# Patient Record
Sex: Female | Born: 1975 | Race: White | Hispanic: No | Marital: Married | Smoking: Never smoker
Health system: Southern US, Community
[De-identification: ages and names within clinical notes are randomized; demographics above are authoritative.]

---

## 2019-08-02 ENCOUNTER — Emergency Department (HOSPITAL_COMMUNITY): Payer: BC Managed Care – PPO

## 2019-08-02 ENCOUNTER — Other Ambulatory Visit: Payer: Self-pay

## 2019-08-02 ENCOUNTER — Encounter (HOSPITAL_COMMUNITY): Payer: Self-pay | Admitting: Emergency Medicine

## 2019-08-02 ENCOUNTER — Observation Stay (HOSPITAL_COMMUNITY)
Admission: EM | Admit: 2019-08-02 | Discharge: 2019-08-04 | Disposition: A | Discharge status: Home / Self Care | Payer: BC Managed Care – PPO | Attending: General Surgery | Admitting: General Surgery

## 2019-08-02 DIAGNOSIS — K8010 Calculus of gallbladder with chronic cholecystitis without obstruction: Secondary | ICD-10-CM | POA: Diagnosis not present

## 2019-08-02 DIAGNOSIS — Z20822 Contact with and (suspected) exposure to covid-19: Secondary | ICD-10-CM | POA: Insufficient documentation

## 2019-08-02 DIAGNOSIS — K8050 Calculus of bile duct without cholangitis or cholecystitis without obstruction: Secondary | ICD-10-CM

## 2019-08-02 DIAGNOSIS — Z91030 Bee allergy status: Secondary | ICD-10-CM | POA: Diagnosis not present

## 2019-08-02 DIAGNOSIS — Z87442 Personal history of urinary calculi: Secondary | ICD-10-CM | POA: Diagnosis not present

## 2019-08-02 DIAGNOSIS — R10811 Right upper quadrant abdominal tenderness: Secondary | ICD-10-CM

## 2019-08-02 DIAGNOSIS — Z91018 Allergy to other foods: Secondary | ICD-10-CM | POA: Diagnosis not present

## 2019-08-02 DIAGNOSIS — K819 Cholecystitis, unspecified: Secondary | ICD-10-CM | POA: Diagnosis present

## 2019-08-02 DIAGNOSIS — Z87891 Personal history of nicotine dependence: Secondary | ICD-10-CM | POA: Diagnosis not present

## 2019-08-02 DIAGNOSIS — Z87892 Personal history of anaphylaxis: Secondary | ICD-10-CM | POA: Diagnosis not present

## 2019-08-02 LAB — BASIC METABOLIC PANEL
Anion gap: 13 (ref 5–15)
BUN: 8 mg/dL (ref 6–20)
CO2: 23 mmol/L (ref 22–32)
Calcium: 9.6 mg/dL (ref 8.9–10.3)
Chloride: 104 mmol/L (ref 98–111)
Creatinine, Ser: 0.78 mg/dL (ref 0.44–1.00)
GFR calc Af Amer: >60 mL/min (ref >60)
GFR calc non Af Amer: >60 mL/min (ref >60)
Glucose, Bld: 102 mg/dL — ABNORMAL HIGH (ref 70–99)
Potassium: 3.7 mmol/L (ref 3.5–5.1)
Sodium: 140 mmol/L (ref 135–145)

## 2019-08-02 LAB — CBC
HCT: 42.3 % (ref 36.0–46.0)
Hemoglobin: 14.1 g/dL (ref 12.0–15.0)
MCH: 30.8 pg (ref 26.0–34.0)
MCHC: 33.3 g/dL (ref 30.0–36.0)
MCV: 92.4 fL (ref 80.0–100.0)
Platelets: 292 10*3/uL (ref 150–400)
RBC: 4.58 MIL/uL (ref 3.87–5.11)
RDW: 12.3 % (ref 11.5–15.5)
WBC: 8.4 10*3/uL (ref 4.0–10.5)
nRBC: 0 % (ref 0.0–0.2)

## 2019-08-02 LAB — HEPATIC FUNCTION PANEL
ALT: 17 U/L (ref 0–44)
AST: 19 U/L (ref 15–41)
Albumin: 4.3 g/dL (ref 3.5–5.0)
Alkaline Phosphatase: 62 U/L (ref 38–126)
Bilirubin, Direct: 0.1 mg/dL (ref 0.0–0.2)
Indirect Bilirubin: 0.6 mg/dL (ref 0.3–0.9)
Total Bilirubin: 0.7 mg/dL (ref 0.3–1.2)
Total Protein: 7.4 g/dL (ref 6.5–8.1)

## 2019-08-02 LAB — I-STAT BETA HCG BLOOD, ED (MC, WL, AP ONLY): I-stat hCG, quantitative: <5 m[IU]/mL (ref <5)

## 2019-08-02 LAB — D-DIMER, QUANTITATIVE: D-Dimer, Quant: 1 ug/mL-FEU — ABNORMAL HIGH (ref 0.00–0.50)

## 2019-08-02 LAB — TROPONIN I (HIGH SENSITIVITY)
Troponin I (High Sensitivity): <2 ng/L (ref <18)
Troponin I (High Sensitivity): <2 ng/L (ref <18)

## 2019-08-02 LAB — LIPASE, BLOOD: Lipase: 22 U/L (ref 11–51)

## 2019-08-02 MED ORDER — ACETAMINOPHEN 325 MG PO TABS
650.0000 mg | ORAL_TABLET | Freq: Four times a day (QID) | ORAL | Status: DC | PRN
  Administered 2019-08-03: 650 mg via ORAL
  Filled 2019-08-02: qty 2

## 2019-08-02 MED ORDER — SODIUM CHLORIDE 0.9 % IV SOLN
2.0000 g | Freq: Once | INTRAVENOUS | Status: AC
  Administered 2019-08-02: 2 g via INTRAVENOUS
  Filled 2019-08-02: qty 20

## 2019-08-02 MED ORDER — SODIUM CHLORIDE 0.9 % IV SOLN
2.0000 g | INTRAVENOUS | Status: DC
  Filled 2019-08-02: qty 20

## 2019-08-02 MED ORDER — ONDANSETRON 4 MG PO TBDP: 4.0000 mg | ORAL_TABLET | Freq: Four times a day (QID) | ORAL | Status: DC | PRN

## 2019-08-02 MED ORDER — ONDANSETRON HCL 4 MG/2ML IJ SOLN
4.0000 mg | Freq: Four times a day (QID) | INTRAMUSCULAR | Status: DC | PRN
  Administered 2019-08-03 – 2019-08-04 (×4): 4 mg via INTRAVENOUS
  Filled 2019-08-02 (×3): qty 2

## 2019-08-02 MED ORDER — MORPHINE SULFATE (PF) 4 MG/ML IV SOLN
4.0000 mg | Freq: Once | INTRAVENOUS | Status: AC
  Administered 2019-08-02: 4 mg via INTRAVENOUS
  Filled 2019-08-02: qty 1

## 2019-08-02 MED ORDER — ACETAMINOPHEN 650 MG RE SUPP: 650.0000 mg | Freq: Four times a day (QID) | RECTAL | Status: DC | PRN

## 2019-08-02 MED ORDER — SIMETHICONE 80 MG PO CHEW
40.0000 mg | CHEWABLE_TABLET | Freq: Four times a day (QID) | ORAL | Status: DC | PRN
  Filled 2019-08-02: qty 1

## 2019-08-02 MED ORDER — ALUM & MAG HYDROXIDE-SIMETH 200-200-20 MG/5ML PO SUSP
30.0000 mL | Freq: Once | ORAL | Status: AC
  Administered 2019-08-02: 30 mL via ORAL
  Filled 2019-08-02: qty 30

## 2019-08-02 MED ORDER — ENOXAPARIN SODIUM 40 MG/0.4ML ~~LOC~~ SOLN
40.0000 mg | SUBCUTANEOUS | Status: DC
  Administered 2019-08-03: 40 mg via SUBCUTANEOUS
  Filled 2019-08-02: qty 0.4

## 2019-08-02 MED ORDER — MORPHINE SULFATE (PF) 2 MG/ML IV SOLN
1.0000 mg | INTRAVENOUS | Status: DC | PRN
  Administered 2019-08-02 – 2019-08-03 (×2): 1 mg via INTRAVENOUS
  Filled 2019-08-02 (×2): qty 1

## 2019-08-02 MED ORDER — FAMOTIDINE 20 MG PO TABS
20.0000 mg | ORAL_TABLET | Freq: Once | ORAL | Status: AC
  Administered 2019-08-02: 20 mg via ORAL
  Filled 2019-08-02: qty 1

## 2019-08-02 MED ORDER — IOHEXOL 350 MG/ML SOLN
80.0000 mL | Freq: Once | INTRAVENOUS | Status: AC | PRN
  Administered 2019-08-02: 80 mL via INTRAVENOUS

## 2019-08-02 MED ORDER — SODIUM CHLORIDE 0.9% FLUSH
3.0000 mL | Freq: Once | INTRAVENOUS | Status: AC
  Administered 2019-08-02: 3 mL via INTRAVENOUS

## 2019-08-02 MED ORDER — SODIUM CHLORIDE 0.9 % IV SOLN: INTRAVENOUS | Status: DC

## 2019-08-02 MED ORDER — KETOROLAC TROMETHAMINE 15 MG/ML IJ SOLN
15.0000 mg | Freq: Four times a day (QID) | INTRAMUSCULAR | Status: DC | PRN
  Administered 2019-08-03 – 2019-08-04 (×2): 15 mg via INTRAVENOUS
  Filled 2019-08-02 (×2): qty 1

## 2019-08-02 NOTE — Discharge Instructions (Addendum)
[facility] ______[facility] °LAPAROSCOPIC SURGERY: POST OP INSTRUCTIONS °Always review your discharge instruction sheet given to you by the facility where your surgery was performed. °IF YOU HAVE DISABILITY OR FAMILY LEAVE FORMS, YOU MUST BRING THEM TO THE OFFICE FOR PROCESSING.   °DO NOT GIVE THEM TO YOUR DOCTOR. ° °1. A prescription for pain medication may be given to you upon discharge.  Take your pain medication as prescribed, if needed.  If narcotic pain medicine is not needed, then you may take acetaminophen (Tylenol) or ibuprofen (Advil) as needed. °2. Take your usually prescribed medications unless otherwise directed. °3. If you need a refill on your pain medication, please contact your pharmacy.  They will contact our office to request authorization. Prescriptions will not be filled after 5pm or on week-ends. °4. You should follow a light diet the first few days after arrival home, such as soup and crackers, etc.  Be sure to include lots of fluids daily. °5. Most patients will experience some swelling and bruising in the area of the incisions.  Ice packs will help.  Swelling and bruising can take several days to resolve.  °6. It is common to experience some constipation if taking pain medication after surgery.  Increasing fluid intake and taking a stool softener (such as Colace) will usually help or prevent this problem from occurring.  A mild laxative (Milk of Magnesia or Miralax) should be taken according to package instructions if there are no bowel movements after 48 hours. °7. Unless discharge instructions indicate otherwise, you may remove your bandages 24-48 hours after surgery, and you may shower at that time.  You may have steri-strips (small skin tapes) in place directly over the incision.  These strips should be left on the skin for 7-10 days.  If your surgeon used skin glue on the incision, you may shower in 24 hours.  The glue will flake off over the next 2-3 weeks.  Any sutures or  staples will be removed at the office during your follow-up visit. °8. ACTIVITIES:  You may resume regular (light) daily activities beginning the next day--such as daily self-care, walking, climbing stairs--gradually increasing activities as tolerated.  You may have sexual intercourse when it is comfortable.  Refrain from any heavy lifting or straining until approved by your doctor. °a. You may drive when you are no longer taking prescription pain medication, you can comfortably wear a seatbelt, and you can safely maneuver your car and apply brakes. °b. RETURN TO WORK:  __________________________________________________________ °9. You should see your doctor in the office for a follow-up appointment approximately 2-3 weeks after your surgery.  Make sure that you call for this appointment within a day or two after you arrive home to insure a convenient appointment time. °10. OTHER INSTRUCTIONS: __________________________________________________________________________________________________________________________ __________________________________________________________________________________________________________________________ °WHEN TO CALL YOUR DOCTOR: °1. Fever over 101.0 °2. Inability to urinate °3. Continued bleeding from incision. °4. Increased pain, redness, or drainage from the incision. °5. Increasing abdominal pain ° °The clinic staff is available to answer your questions during regular business hours.  Please don’t hesitate to call and ask to speak to one of the nurses for clinical concerns.  If you have a medical emergency, go to the nearest emergency room or call 911.  A surgeon from [facility] is always on call at the hospital. °[address], [address], [place], [place]  [address] ? [address] [address], [place],    [address] °[phone] ? [phone] ? FAX [phone] °Web site:   [email or web address] °

## 2019-08-02 NOTE — ED Notes (Signed)
PIV to RAC noted to be infiltrated. PIV removed, catheter intact.  18G PIV initiated to LAC. Flushes with 10 cc NS without s/s of infiltration. Positive blood return, secured with tape and tegaderm

## 2019-08-02 NOTE — ED Notes (Signed)
All meds given per [facility]. Name/DOB verified with pt. Surgery MD at bedside.  Pt remains in NAD. VSS. Breathing easy, non-labored.

## 2019-08-02 NOTE — ED Notes (Signed)
Assumed care of pt. Pt alert, speaking in full sentences. Breathing easy, non-labored. Pt resting on cart in NAD. Call light within reach

## 2019-08-02 NOTE — ED Provider Notes (Addendum)
[facility] EMERGENCY DEPARTMENT Provider Note   CSN: [id number] Arrival date & time: 08/02/19  1218     History Chief Complaint  Patient presents with  . Chest Pain    Joyce Cervantes is a 44 y.o. female.  HPI   Pt is a 44 y/o female with a h/o nephrolithiasis, who presents to the ED today for eval of chest pain. States she had an episode of right sided chest pain that started at 12:30AM this morning that woke her up from sleep. States pain was excruciating and radiated to her right shoulder and back. States episode lasted for about 15 minutes. Tried to drink some water which made her feel nauseated. States she was at work this AM and again experienced mid to right sided pain that felt like a squeezing and a heaviness. Started while walking in from her car. Reports mild associated sob as well. Took four 81 mg ASA at 12PM. This improved sxs.  She denies that her chest pain is specifically provoked with exertion as she did not have any during other times when she exerted herself today. Denies associated fevers, vomiting, abd pain, cough.   Denies leg pain/swelling, hemoptysis, recent surgery/trauma, recent long travel, hormone use, personal hx of cancer, or hx of DVT/PE. Has a h/o tobacco use but quit 6 years ago. Used to smoke 1.5ppd for at least 15-20 years. Denies h/o HTN, HLD, DM. Pts father died of an MI at 24.   History reviewed. No pertinent past medical history.  Patient Active Problem List   Diagnosis Date Noted  . Cholecystitis 08/02/2019    History reviewed. No pertinent surgical history.   OB History   No obstetric history on file.     History reviewed. No pertinent family history.  Social History   Tobacco Use  . Smoking status: Never Smoker  Substance Use Topics  . Alcohol use: Yes  . Drug use: Never    Home Medications Prior to Admission medications   Medication Sig Start Date End Date Taking? Authorizing Provider  ibuprofen  (ADVIL) 200 MG tablet Take 400 mg by mouth every 6 (six) hours as needed for headache (pain/cramps).   Yes [provider]    Allergies    Bee venom and Raspberry  Review of Systems   Review of Systems  Constitutional: Positive for diaphoresis. Negative for fever.  HENT: Negative for ear pain and sore throat.   Eyes: Negative for visual disturbance.  Respiratory: Positive for shortness of breath. Negative for cough.   Cardiovascular: Positive for chest pain and palpitations. Negative for leg swelling.  Gastrointestinal: Positive for nausea. Negative for abdominal pain, constipation, diarrhea and vomiting.  Genitourinary: Negative for dysuria and hematuria.  Musculoskeletal: Negative for back pain.  Skin: Negative for rash.  Neurological: Positive for light-headedness. Negative for dizziness.  All other systems reviewed and are negative.   Physical Exam Updated Vital Signs BP 132/80 (BP Location: Left Arm)   Pulse 87   Temp 98 F (36.7 C) (Oral)   Resp 16   Ht 5\' 7"  (1.702 m)   Wt 80.3 kg   LMP 07/15/2019   SpO2 100%   BMI 27.72 kg/m   Physical Exam Vitals and nursing note reviewed.  Constitutional:      General: She is not in acute distress.    Appearance: She is well-developed. She is not ill-appearing or toxic-appearing.  HENT:     Head: Normocephalic and atraumatic.  Eyes:  Conjunctiva/sclera: Conjunctivae normal.  Cardiovascular:     Rate and Rhythm: Normal rate and regular rhythm.     Heart sounds: Normal heart sounds. No murmur.  Pulmonary:     Effort: Pulmonary effort is normal. No respiratory distress.     Breath sounds: Normal breath sounds. No decreased breath sounds, wheezing, rhonchi or rales.  Abdominal:     Palpations: Abdomen is soft.     Tenderness: There is no abdominal tenderness.  Musculoskeletal:     Cervical back: Neck supple.     Right lower leg: No tenderness. No edema.     Left lower leg: No tenderness. No edema.  Skin:     General: Skin is warm and dry.  Neurological:     Mental Status: She is alert.     ED Results / Procedures / Treatments   Labs (all labs ordered are listed, but only abnormal results are displayed) Labs Reviewed  BASIC METABOLIC PANEL - Abnormal; Notable for the following components:      Result Value   Glucose, Bld 102 (*)    All other components within normal limits  D-DIMER, QUANTITATIVE (NOT AT [facility]) - Abnormal; Notable for the following components:   D-Dimer, Quant 1.00 (*)    All other components within normal limits  SARS CORONAVIRUS 2 (TAT 6-24 HRS)  CBC  HEPATIC FUNCTION PANEL  COMPREHENSIVE METABOLIC PANEL  CBC  LIPASE, BLOOD  I-STAT BETA HCG BLOOD, ED ([facility], [facility], [facility] ONLY)  TROPONIN I (HIGH SENSITIVITY)  TROPONIN I (HIGH SENSITIVITY)    EKG EKG Interpretation  Date/Time:  Monday August 02 2019 12:23:14 EST Ventricular Rate:  77 PR Interval:  118 QRS Duration: 92 QT Interval:  380 QTC Calculation: 430 R Axis:   67 Text Interpretation: Normal sinus rhythm with sinus arrhythmia Normal ECG NO prior ECG for comparisonl No STEMI Confirmed by Theda Belfast ([id number]) on 08/02/2019 2:00:45 PM   Radiology DG Chest 2 View  Result Date: 08/02/2019 CLINICAL DATA:  Chest pain EXAM: CHEST - 2 VIEW COMPARISON:  None. FINDINGS: The heart size and mediastinal contours are within normal limits. Both lungs are clear. No pleural effusion or pneumothorax. The visualized skeletal structures are unremarkable. IMPRESSION: No acute process in the chest. Electronically Signed   By: Guadlupe Spanish M.D.   On: 08/02/2019 13:39   CT Angio Chest PE W and/or Wo Contrast  Result Date: 08/02/2019 CLINICAL DATA:  Chest pain. Positive D-dimer. EXAM: CT ANGIOGRAPHY CHEST WITH CONTRAST TECHNIQUE: Multidetector CT imaging of the chest was performed using the standard protocol during bolus administration of intravenous contrast. Multiplanar CT image reconstructions and MIPs were obtained to evaluate the  vascular anatomy. CONTRAST:  68mL OMNIPAQUE IOHEXOL 350 MG/ML SOLN COMPARISON:  Chest x-ray dated 08/02/2019 FINDINGS: Cardiovascular: Satisfactory opacification of the pulmonary arteries to the segmental level. No evidence of pulmonary embolism. Normal heart size. No pericardial effusion. Mediastinum/Nodes: No enlarged mediastinal, hilar, or axillary lymph nodes. Thyroid gland, trachea, and esophagus demonstrate no significant findings. Lungs/Pleura: Lungs are clear. No pleural effusion or pneumothorax. Upper Abdomen: No acute abnormality. 2 cm calcified gallstone. Musculoskeletal: No chest wall abnormality. No acute or significant osseous findings. Review of the MIP images confirms the above findings. IMPRESSION: 1. No pulmonary emboli or other acute abnormalities. 2. 2 cm calcified gallstone. Electronically Signed   By: Francene Boyers M.D.   On: 08/02/2019 18:46   US Abdomen Limited RUQ  Result Date: 08/02/2019 CLINICAL DATA:  44 year old female with chest pain, right upper quadrant  pain for 1 day. 2 centimeter gallstone on chest CTA earlier today. EXAM: ULTRASOUND ABDOMEN LIMITED RIGHT UPPER QUADRANT COMPARISON:  Chest CTA 1828 hours today. FINDINGS: Gallbladder: Relatively large 24 millimeter shadowing gallstone (image 3). However, gallbladder wall thickness remains normal. No pericholecystic fluid. However, positive sonographic Eulah Pont sign is reported by the technologist. Common bile duct: Diameter: 1-2 millimeters, normal. Liver: No focal lesion identified. Within normal limits in parenchymal echogenicity. Portal vein is patent on color Doppler imaging with normal direction of blood flow towards the liver. Other: Negative visible right kidney. IMPRESSION: 1. A 2.4 cm gallstone is re-demonstrated, and a positive sonographic Murphy's sign is reported, although absent gallbladder wall thickening argues against Acute Cholecystitis. 2. No evidence of bile duct obstruction.  Negative liver. Electronically  Signed   By: Odessa Fleming M.D.   On: 08/02/2019 20:25    Procedures Procedures (including critical care time)  Medications Ordered in ED Medications  cefTRIAXone (ROCEPHIN) 2 g in sodium chloride 0.9 % 100 mL IVPB (2 g Intravenous New Bag/Given 08/02/19 2114)  enoxaparin (LOVENOX) injection 40 mg (has no administration in time range)  0.9 %  sodium chloride infusion (has no administration in time range)  cefTRIAXone (ROCEPHIN) 2 g in sodium chloride 0.9 % 100 mL IVPB (has no administration in time range)  acetaminophen (TYLENOL) tablet 650 mg (has no administration in time range)    Or  acetaminophen (TYLENOL) suppository 650 mg (has no administration in time range)  ketorolac (TORADOL) 15 MG/ML injection 15 mg (has no administration in time range)  morphine 2 MG/ML injection 1 mg (has no administration in time range)  ondansetron (ZOFRAN-ODT) disintegrating tablet 4 mg (has no administration in time range)    Or  ondansetron (ZOFRAN) injection 4 mg (has no administration in time range)  simethicone (MYLICON) chewable tablet 40 mg (has no administration in time range)  sodium chloride flush (NS) 0.9 % injection 3 mL (3 mLs Intravenous Given 08/02/19 1244)  alum & mag hydroxide-simeth (MAALOX/MYLANTA) 200-200-20 MG/5ML suspension 30 mL (30 mLs Oral Given 08/02/19 1429)  famotidine (PEPCID) tablet 20 mg (20 mg Oral Given 08/02/19 1429)  iohexol (OMNIPAQUE) 350 MG/ML injection 80 mL (80 mLs Intravenous Contrast Given 08/02/19 1819)  morphine 4 MG/ML injection 4 mg (4 mg Intravenous Given 08/02/19 2115)    ED Course  I have reviewed the triage vital signs and the nursing notes.  Pertinent labs & imaging results that were available during my care of the patient were reviewed by me and considered in my medical decision making (see chart for details).  Clinical Course as of Aug 01 2126  Mon Aug 02, 2019  1313 DG Chest 2 View [JN]    Clinical Course User Index [JN] Lester Kinsman    MDM Rules/Calculators/A&P                      44 year old female presenting for evaluation of right-sided chest pain that occurred last night and radiated to her back.  Symptoms resolved and then recurred again while she was at work.  Denies that symptoms are specifically exertional.  She did have some associated shortness of breath.   Vital signs are reassuring here in the ED.  Her exam is reassuring, heart with regular rate and rhythm.  Lungs are clear to auscultation bilaterally.  No peripheral edema or calf tenderness bilaterally.  CBC is without anemia or leukocytosis BMP is nonacute Beta-hCG is negative Initial troponin is negative, delta troponin neg  -  Patient with heart score of 2 based on history and risk factors.  She is low risk for ACS and her symptoms sound atypical for this  Liver enzymes wnl D-dimer positive, will get cta chest   EKG showed normal sinus rhythm with sinus arrhythmia, no ischemic changes.  Chest x-ray is without pneumonia, pneumothorax, cardiomegaly or other acute abnormality  CTA chest neg for PE. Did show 2cm gallstone. On reassessment pt is still c/o pain. Reassessed her abd and she is now having ttp to the ruq. Will get [place].   RUQ [place] : 1. A 2.4 cm gallstone is re-demonstrated, and a positive sonographic Murphy's sign is reported, although absent gallbladder wall thickening argues against Acute Cholecystitis.  8:41 PM CONSULT with Dr. Dwain Sarna who agrees to admit the patient for surgery.   Final Clinical Impression(s) / ED Diagnoses Final diagnoses:  RUQ abdominal tenderness  Biliary colic    Rx / DC Orders ED Discharge Orders    None       Karrie Meres, PA-C 08/02/19 1930    Karrie Meres, PA-C 08/02/19 2128    Tegeler, Canary Brim, MD 08/03/19 [phone]

## 2019-08-02 NOTE — ED Triage Notes (Signed)
Pt reports substernal right sided CP throughout the night last night, described as a squeezing sensation and sharp at times. Pain returned today while at work. Denies recent fever, cough, sick contacts. No medical hx, no daily meds. VSS. Alert, oriented x4.

## 2019-08-02 NOTE — ED Notes (Signed)
Ice water provided, ok per Admitting MD Covid swab collected, labeled with 2 pt identifiers, and brought to lab

## 2019-08-02 NOTE — H&P (Signed)
Joyce Cervantes is an 44 y.o. female.   Chief Complaint: ruq pain HPI: 43 yof who had acute onset of chest on right at 1230 am that awoke her from sleep.  This lasted about fifteen minutes and then resolved. It was associated with nausea and radiated to back. This recurred at work today and had more heavy chest pain and then had sob.  She did not have fever, n/v. She was seen in er and underwent ct angio and cardiac eval that have been negative. Her pain is now localized in the ruq.  She had a 2 cm calcified gallstone noted on ct angio.  She then underwent an [place] that showed a 2.4 cm gallstone and had a sono murphys sign as well.   lfts are normal.  Will check lipase.    History reviewed. No pertinent past medical history.  History reviewed. No pertinent surgical history.  History reviewed. No pertinent family history. Social History:  reports that she has never smoked. She does not have any smokeless tobacco history on file. She reports current alcohol use. She reports that she does not use drugs.  Allergies:  Allergies  Allergen Reactions  . Bee Venom Anaphylaxis  . Raspberry Rash    meds none  Results for orders placed or performed during the hospital encounter of 08/02/19 (from the past 48 hour(s))  Basic metabolic panel     Status: Abnormal   Collection Time: 08/02/19 12:32 PM  Result Value Ref Range   Sodium 140 135 - 145 mmol/L   Potassium 3.7 3.5 - 5.1 mmol/L   Chloride 104 98 - 111 mmol/L   CO2 23 22 - 32 mmol/L   Glucose, Bld 102 (H) 70 - 99 mg/dL   BUN 8 6 - 20 mg/dL   Creatinine, Ser 0.78 0.44 - 1.00 mg/dL   Calcium 9.6 8.9 - 10.3 mg/dL   GFR calc non Af Amer >60 >60 mL/min   GFR calc Af Amer >60 >60 mL/min   Anion gap 13 5 - 15    Comment: Performed at [facility] [address], [place] [address]  CBC     Status: None   Collection Time: 08/02/19 12:32 PM  Result Value Ref Range   WBC 8.4 4.0 - 10.5 K/uL   RBC 4.58 3.87 - 5.11 MIL/uL    Hemoglobin 14.1 12.0 - 15.0 g/dL   HCT 42.3 36.0 - 46.0 %   MCV 92.4 80.0 - 100.0 fL   MCH 30.8 26.0 - 34.0 pg   MCHC 33.3 30.0 - 36.0 g/dL   RDW 12.3 11.5 - 15.5 %   Platelets 292 150 - 400 K/uL   nRBC 0.0 0.0 - 0.2 %    Comment: Performed at [facility], [place] [address], [place], [place] [address]  Troponin I (High Sensitivity)     Status: None   Collection Time: 08/02/19 12:32 PM  Result Value Ref Range   Troponin I (High Sensitivity) <2 <18 ng/L    Comment: (NOTE) Elevated high sensitivity troponin I (hsTnI) values and significant  changes across serial measurements may suggest ACS but many other  chronic and acute conditions are known to elevate hsTnI results.  Refer to the Links section for chest pain algorithms and additional  guidance. Performed at [facility], [place] [address] [place], [place] [address]   I-Stat beta hCG blood, ED     Status: None   Collection Time: 08/02/19 12:34 PM  Result Value Ref Range   I-stat hCG, quantitative <5.0 <5 mIU/mL   Comment 3            Comment:   GEST. AGE      CONC.  (mIU/mL)   <=1 WEEK        5 - 50     2 WEEKS       50 - 500     3 WEEKS       100 - 10,000     4 WEEKS     1,000 - 30,000        FEMALE AND NON-PREGNANT FEMALE:     LESS THAN 5 mIU/mL   Hepatic function panel     Status: None   Collection Time: 08/02/19  2:25 PM  Result Value Ref Range   Total Protein 7.4 6.5 - 8.1 g/dL   Albumin 4.3 3.5 - 5.0 g/dL   AST 19 15 - 41 U/L   ALT 17 0 - 44 U/L   Alkaline Phosphatase 62 38 - 126 U/L   Total Bilirubin 0.7 0.3 - 1.2 mg/dL   Bilirubin, Direct 0.1 0.0 - 0.2 mg/dL   Indirect Bilirubin 0.6 0.3 - 0.9 mg/dL    Comment: Performed at [facility], [address], [place], [place] [address]  D-dimer, quantitative (not at [facility])     Status: Abnormal   Collection Time: 08/02/19  2:25 PM  Result Value Ref Range   D-Dimer, Quant 1.00 (H) 0.00 - 0.50 ug/mL-FEU    Comment: (NOTE) At the manufacturer cut-off of  0.50 ug/mL FEU, this assay has been documented to exclude PE with a sensitivity and negative predictive value of 97 to 99%.  At this time, this assay has not been approved by the FDA to exclude DVT/VTE. Results should be correlated with clinical presentation. Performed at [facility], [address], [place], [place] [address]   Troponin I (High Sensitivity)     Status: None   Collection Time: 08/02/19  2:25 PM  Result Value Ref Range   Troponin I (High Sensitivity) <2 <18 ng/L    Comment: (NOTE) Elevated high sensitivity troponin I (hsTnI) values and significant  changes across serial measurements may suggest ACS but many other  chronic and acute conditions are known to elevate hsTnI results.  Refer to the Links section for chest pain algorithms and additional  guidance. Performed at [facility], [address] [place], [place] [address]    DG Chest 2 View  Result Date: 08/02/2019 CLINICAL DATA:  Chest pain EXAM: CHEST - 2 VIEW COMPARISON:  None. FINDINGS: The heart size and mediastinal contours are within normal limits. Both lungs are clear. No pleural effusion or pneumothorax. The visualized skeletal structures are unremarkable. IMPRESSION: No acute process in the chest. Electronically Signed   By: Guadlupe Spanish M.D.   On: 08/02/2019 13:39   CT Angio Chest PE W and/or Wo Contrast  Result Date: 08/02/2019 CLINICAL DATA:  Chest pain. Positive D-dimer. EXAM: CT ANGIOGRAPHY CHEST WITH CONTRAST TECHNIQUE: Multidetector CT imaging of the chest was performed using the standard protocol during bolus administration of intravenous contrast. Multiplanar CT image reconstructions and MIPs were obtained to evaluate the vascular anatomy. CONTRAST:  50mL OMNIPAQUE IOHEXOL 350 MG/ML SOLN COMPARISON:  Chest x-ray dated 08/02/2019 FINDINGS: Cardiovascular: Satisfactory opacification of the pulmonary arteries to the segmental level. No evidence of pulmonary embolism. Normal heart size. No  pericardial effusion. Mediastinum/Nodes: No enlarged mediastinal, hilar,  or axillary lymph nodes. Thyroid gland, trachea, and esophagus demonstrate no significant findings. Lungs/Pleura: Lungs are clear. No pleural effusion or pneumothorax. Upper Abdomen: No acute abnormality. 2 cm calcified gallstone. Musculoskeletal: No chest wall abnormality. No acute or significant osseous findings. Review of the MIP images confirms the above findings. IMPRESSION: 1. No pulmonary emboli or other acute abnormalities. 2. 2 cm calcified gallstone. Electronically Signed   By: Francene Boyers M.D.   On: 08/02/2019 18:46   US Abdomen Limited RUQ  Result Date: 08/02/2019 CLINICAL DATA:  44 year old female with chest pain, right upper quadrant pain for 1 day. 2 centimeter gallstone on chest CTA earlier today. EXAM: ULTRASOUND ABDOMEN LIMITED RIGHT UPPER QUADRANT COMPARISON:  Chest CTA 1828 hours today. FINDINGS: Gallbladder: Relatively large 24 millimeter shadowing gallstone (image 3). However, gallbladder wall thickness remains normal. No pericholecystic fluid. However, positive sonographic Eulah Pont sign is reported by the technologist. Common bile duct: Diameter: 1-2 millimeters, normal. Liver: No focal lesion identified. Within normal limits in parenchymal echogenicity. Portal vein is patent on color Doppler imaging with normal direction of blood flow towards the liver. Other: Negative visible right kidney. IMPRESSION: 1. A 2.4 cm gallstone is re-demonstrated, and a positive sonographic Murphy's sign is reported, although absent gallbladder wall thickening argues against Acute Cholecystitis. 2. No evidence of bile duct obstruction.  Negative liver. Electronically Signed   By: Odessa Fleming M.D.   On: 08/02/2019 20:25    Review of Systems  Respiratory: Positive for chest tightness and shortness of breath.   Gastrointestinal: Positive for abdominal pain and nausea. Negative for abdominal distention, constipation, diarrhea and vomiting.   All other systems reviewed and are negative.   Blood pressure 132/80, pulse 87, temperature 98 F (36.7 C), temperature source Oral, resp. rate 16, height 5\' 7"  (1.702 m), weight 80.3 kg, last menstrual period 07/15/2019, SpO2 100 %. Physical Exam  Vitals reviewed. Constitutional: She is oriented to person, place, and time. She appears well-developed and well-nourished.  HENT:  Head: Normocephalic and atraumatic.  Eyes: Pupils are equal, round, and reactive to light. No scleral icterus.  Cardiovascular: Normal rate, regular rhythm and normal heart sounds.  Respiratory: Effort normal and breath sounds normal.  GI: Soft. Bowel sounds are normal. She exhibits no distension. There is abdominal tenderness in the right upper quadrant. There is positive Murphy's sign. No hernia.  Musculoskeletal:        General: No deformity.     Cervical back: Neck supple.  Lymphadenopathy:    She has no cervical adenopathy.  Neurological: She is alert and oriented to person, place, and time.  Skin: Skin is warm and dry.  Psychiatric: She has a normal mood and affect. Her behavior is normal.     Assessment/Plan Cholecystitis -I think symptoms all related to what clinically is cholecystitis Will admit, abx and plan for lap chole in am by Dr 09/12/2019 -I discussed the procedure in detail.  We discussed the risks and benefits of a laparoscopic cholecystectomyincluding, but not limited to bleeding, infection, injury to surrounding structures such as the intestine or liver, bile leak, retained gallstones, need to convert to an open procedure, prolonged diarrhea, blood clots such as  DVT, common bile duct injury, anesthesia risks, and possible need for additional procedures.  The likelihood of improvement in symptoms and return to the patient's normal status is good. We discussed the typical post-operative recovery course.   Janee Morn, MD 08/02/2019, 9:20 PM

## 2019-08-02 NOTE — ED Notes (Signed)
Report given to Byrd Hesselbach, [place]. All questions answered.

## 2019-08-02 NOTE — ED Notes (Signed)
Pt taken to US in NAD

## 2019-08-02 NOTE — ED Notes (Signed)
Pt transported to 6N Rm 14 in NAD with all belongings via wheelchair by the tech. Pt alert, speaking in full sentences. Breathing easy, non-labored. Equal rise and fall of chest noted. VSS.

## 2019-08-02 NOTE — ED Notes (Signed)
Stuck patient for her labs in her left [facility]

## 2019-08-03 ENCOUNTER — Encounter (HOSPITAL_COMMUNITY): Payer: Self-pay

## 2019-08-03 ENCOUNTER — Encounter (HOSPITAL_COMMUNITY)
Admission: EM | Disposition: A | Discharge status: Home / Self Care | Payer: Self-pay | Source: Home / Self Care | Attending: Emergency Medicine

## 2019-08-03 ENCOUNTER — Observation Stay (HOSPITAL_COMMUNITY): Payer: BC Managed Care – PPO | Admitting: Certified Registered Nurse Anesthetist

## 2019-08-03 HISTORY — PX: CHOLECYSTECTOMY: SHX55

## 2019-08-03 LAB — COMPREHENSIVE METABOLIC PANEL
ALT: 14 U/L (ref 0–44)
AST: 18 U/L (ref 15–41)
Albumin: 3.7 g/dL (ref 3.5–5.0)
Alkaline Phosphatase: 52 U/L (ref 38–126)
Anion gap: 9 (ref 5–15)
BUN: 8 mg/dL (ref 6–20)
CO2: 25 mmol/L (ref 22–32)
Calcium: 9.3 mg/dL (ref 8.9–10.3)
Chloride: 106 mmol/L (ref 98–111)
Creatinine, Ser: 0.77 mg/dL (ref 0.44–1.00)
GFR calc Af Amer: >60 mL/min (ref >60)
GFR calc non Af Amer: >60 mL/min (ref >60)
Glucose, Bld: 95 mg/dL (ref 70–99)
Potassium: 4.2 mmol/L (ref 3.5–5.1)
Sodium: 140 mmol/L (ref 135–145)
Total Bilirubin: 0.8 mg/dL (ref 0.3–1.2)
Total Protein: 6.4 g/dL — ABNORMAL LOW (ref 6.5–8.1)

## 2019-08-03 LAB — CBC
HCT: 38 % (ref 36.0–46.0)
Hemoglobin: 13 g/dL (ref 12.0–15.0)
MCH: 31 pg (ref 26.0–34.0)
MCHC: 34.2 g/dL (ref 30.0–36.0)
MCV: 90.7 fL (ref 80.0–100.0)
Platelets: 255 10*3/uL (ref 150–400)
RBC: 4.19 MIL/uL (ref 3.87–5.11)
RDW: 12.4 % (ref 11.5–15.5)
WBC: 9.7 10*3/uL (ref 4.0–10.5)
nRBC: 0 % (ref 0.0–0.2)

## 2019-08-03 LAB — SURGICAL PCR SCREEN
MRSA, PCR: NEGATIVE
Staphylococcus aureus: NEGATIVE

## 2019-08-03 LAB — SARS CORONAVIRUS 2 (TAT 6-24 HRS): SARS Coronavirus 2: NEGATIVE

## 2019-08-03 SURGERY — LAPAROSCOPIC CHOLECYSTECTOMY WITH INTRAOPERATIVE CHOLANGIOGRAM
Anesthesia: General | Site: Abdomen

## 2019-08-03 MED ORDER — STERILE WATER FOR IRRIGATION IR SOLN
Status: DC | PRN
  Administered 2019-08-03: 1000 mL

## 2019-08-03 MED ORDER — FENTANYL CITRATE (PF) 250 MCG/5ML IJ SOLN
INTRAMUSCULAR | Status: AC
  Filled 2019-08-03: qty 5

## 2019-08-03 MED ORDER — MIDAZOLAM HCL 2 MG/2ML IJ SOLN
INTRAMUSCULAR | Status: AC
  Filled 2019-08-03: qty 2

## 2019-08-03 MED ORDER — DEXAMETHASONE SODIUM PHOSPHATE 10 MG/ML IJ SOLN
INTRAMUSCULAR | Status: DC | PRN
  Administered 2019-08-03: 10 mg via INTRAVENOUS

## 2019-08-03 MED ORDER — LIDOCAINE 2% (20 MG/ML) 5 ML SYRINGE
INTRAMUSCULAR | Status: DC | PRN
  Administered 2019-08-03: 60 mg via INTRAVENOUS

## 2019-08-03 MED ORDER — ONDANSETRON HCL 4 MG/2ML IJ SOLN
INTRAMUSCULAR | Status: AC
  Filled 2019-08-03: qty 4

## 2019-08-03 MED ORDER — ONDANSETRON HCL 4 MG/2ML IJ SOLN
INTRAMUSCULAR | Status: AC
  Filled 2019-08-03: qty 2

## 2019-08-03 MED ORDER — MIDAZOLAM HCL 2 MG/2ML IJ SOLN
INTRAMUSCULAR | Status: DC | PRN
  Administered 2019-08-03: 2 mg via INTRAVENOUS

## 2019-08-03 MED ORDER — SODIUM CHLORIDE 0.9 % IV SOLN
INTRAVENOUS | Status: DC | PRN
  Administered 2019-08-03: 100 mL

## 2019-08-03 MED ORDER — DEXAMETHASONE SODIUM PHOSPHATE 10 MG/ML IJ SOLN
INTRAMUSCULAR | Status: AC
  Filled 2019-08-03: qty 1

## 2019-08-03 MED ORDER — OXYCODONE HCL 5 MG PO TABS
5.0000 mg | ORAL_TABLET | ORAL | Status: DC | PRN
  Administered 2019-08-03 – 2019-08-04 (×5): 10 mg via ORAL
  Filled 2019-08-03 (×5): qty 2

## 2019-08-03 MED ORDER — HYDROMORPHONE HCL 1 MG/ML IJ SOLN
0.2500 mg | INTRAMUSCULAR | Status: DC | PRN
  Administered 2019-08-03 (×2): 0.5 mg via INTRAVENOUS

## 2019-08-03 MED ORDER — SODIUM CHLORIDE 0.9 % IR SOLN
Status: DC | PRN
  Administered 2019-08-03: 1000 mL

## 2019-08-03 MED ORDER — ROCURONIUM BROMIDE 10 MG/ML (PF) SYRINGE
PREFILLED_SYRINGE | INTRAVENOUS | Status: AC
  Filled 2019-08-03: qty 10

## 2019-08-03 MED ORDER — SUCCINYLCHOLINE CHLORIDE 200 MG/10ML IV SOSY
PREFILLED_SYRINGE | INTRAVENOUS | Status: AC
  Filled 2019-08-03: qty 10

## 2019-08-03 MED ORDER — ROCURONIUM BROMIDE 10 MG/ML (PF) SYRINGE
PREFILLED_SYRINGE | INTRAVENOUS | Status: DC | PRN
  Administered 2019-08-03: 40 mg via INTRAVENOUS

## 2019-08-03 MED ORDER — LACTATED RINGERS IV SOLN: INTRAVENOUS | Status: DC | PRN

## 2019-08-03 MED ORDER — PHENYLEPHRINE 40 MCG/ML (10ML) SYRINGE FOR IV PUSH (FOR BLOOD PRESSURE SUPPORT)
PREFILLED_SYRINGE | INTRAVENOUS | Status: AC
  Filled 2019-08-03: qty 20

## 2019-08-03 MED ORDER — ACETAMINOPHEN 500 MG PO TABS
ORAL_TABLET | ORAL | Status: AC
  Administered 2019-08-03: 1000 mg via ORAL
  Filled 2019-08-03: qty 2

## 2019-08-03 MED ORDER — DEXAMETHASONE SODIUM PHOSPHATE 10 MG/ML IJ SOLN
INTRAMUSCULAR | Status: AC
  Filled 2019-08-03: qty 2

## 2019-08-03 MED ORDER — ACETAMINOPHEN 325 MG PO TABS: 650.0000 mg | ORAL_TABLET | Freq: Four times a day (QID) | ORAL | Status: AC | PRN

## 2019-08-03 MED ORDER — FENTANYL CITRATE (PF) 250 MCG/5ML IJ SOLN
INTRAMUSCULAR | Status: DC | PRN
  Administered 2019-08-03: 50 ug via INTRAVENOUS
  Administered 2019-08-03: 100 ug via INTRAVENOUS
  Administered 2019-08-03 (×2): 50 ug via INTRAVENOUS

## 2019-08-03 MED ORDER — PROPOFOL 10 MG/ML IV BOLUS
INTRAVENOUS | Status: AC
  Filled 2019-08-03: qty 20

## 2019-08-03 MED ORDER — OXYCODONE HCL 5 MG PO TABS: 5.0000 mg | ORAL_TABLET | Freq: Four times a day (QID) | ORAL | 0 refills | Status: DC | PRN

## 2019-08-03 MED ORDER — PROPOFOL 10 MG/ML IV BOLUS
INTRAVENOUS | Status: DC | PRN
  Administered 2019-08-03: 110 mg via INTRAVENOUS

## 2019-08-03 MED ORDER — LIDOCAINE 2% (20 MG/ML) 5 ML SYRINGE
INTRAMUSCULAR | Status: AC
  Filled 2019-08-03: qty 10

## 2019-08-03 MED ORDER — ACETAMINOPHEN 500 MG PO TABS: 1000.0000 mg | ORAL_TABLET | Freq: Once | ORAL | Status: AC

## 2019-08-03 MED ORDER — SUGAMMADEX SODIUM 200 MG/2ML IV SOLN
INTRAVENOUS | Status: DC | PRN
  Administered 2019-08-03: 100 mg via INTRAVENOUS

## 2019-08-03 MED ORDER — LIDOCAINE 2% (20 MG/ML) 5 ML SYRINGE
INTRAMUSCULAR | Status: AC
  Filled 2019-08-03: qty 5

## 2019-08-03 MED ORDER — HYDROMORPHONE HCL 1 MG/ML IJ SOLN
INTRAMUSCULAR | Status: AC
  Filled 2019-08-03: qty 1

## 2019-08-03 MED ORDER — LABETALOL HCL 5 MG/ML IV SOLN
INTRAVENOUS | Status: AC
  Filled 2019-08-03: qty 4

## 2019-08-03 MED ORDER — 0.9 % SODIUM CHLORIDE (POUR BTL) OPTIME
TOPICAL | Status: DC | PRN
  Administered 2019-08-03: 1000 mL

## 2019-08-03 MED ORDER — ROCURONIUM BROMIDE 10 MG/ML (PF) SYRINGE
PREFILLED_SYRINGE | INTRAVENOUS | Status: AC
  Filled 2019-08-03: qty 20

## 2019-08-03 MED ORDER — BUPIVACAINE HCL 0.25 % IJ SOLN
INTRAMUSCULAR | Status: DC | PRN
  Administered 2019-08-03: 20 mL

## 2019-08-03 MED ORDER — BUPIVACAINE HCL (PF) 0.25 % IJ SOLN
INTRAMUSCULAR | Status: AC
  Filled 2019-08-03: qty 30

## 2019-08-03 MED FILL — oxyCODONE HCL 5 MG TABS: 5 | 4 days supply | Qty: 20 | Fill #0

## 2019-08-03 SURGICAL SUPPLY — 44 items
APPLIER CLIP 5 13 M/L LIGAMAX5 (MISCELLANEOUS) ×3
BLADE CLIPPER SURG (BLADE) IMPLANT
CANISTER SUCT 3000ML PPV (MISCELLANEOUS) ×3 IMPLANT
CHLORAPREP W/TINT 26 (MISCELLANEOUS) ×3 IMPLANT
CLIP APPLIE 5 13 M/L LIGAMAX5 (MISCELLANEOUS) ×1 IMPLANT
COVER MAYO STAND STRL (DRAPES) ×3 IMPLANT
COVER SURGICAL LIGHT HANDLE (MISCELLANEOUS) ×3 IMPLANT
COVER WAND RF STERILE (DRAPES) IMPLANT
DERMABOND ADVANCED (GAUZE/BANDAGES/DRESSINGS) ×2
DERMABOND ADVANCED .7 DNX12 (GAUZE/BANDAGES/DRESSINGS) ×1 IMPLANT
DRAPE C-ARM 42X120 X-RAY (DRAPES) ×3 IMPLANT
ELECT REM PT RETURN 9FT ADLT (ELECTROSURGICAL) ×3
ELECTRODE REM PT RTRN 9FT ADLT (ELECTROSURGICAL) ×1 IMPLANT
FILTER SMOKE EVAC LAPAROSHD (FILTER) IMPLANT
GLOVE BIO SURGEON STRL SZ8 (GLOVE) ×3 IMPLANT
GLOVE BIOGEL PI IND STRL 8 (GLOVE) ×1 IMPLANT
GLOVE BIOGEL PI INDICATOR 8 (GLOVE) ×2
GOWN STRL REUS W/ TWL LRG LVL3 (GOWN DISPOSABLE) ×4 IMPLANT
GOWN STRL REUS W/ TWL XL LVL3 (GOWN DISPOSABLE) ×1 IMPLANT
GOWN STRL REUS W/TWL LRG LVL3 (GOWN DISPOSABLE) ×8
GOWN STRL REUS W/TWL XL LVL3 (GOWN DISPOSABLE) ×2
KIT BASIN OR (CUSTOM PROCEDURE TRAY) ×3 IMPLANT
KIT TURNOVER KIT B (KITS) ×3 IMPLANT
L-HOOK LAP DISP 36CM (ELECTROSURGICAL) ×3
LHOOK LAP DISP 36CM (ELECTROSURGICAL) ×1 IMPLANT
NEEDLE 22X1 1/2 (OR ONLY) (NEEDLE) ×3 IMPLANT
NS IRRIG 1000ML POUR BTL (IV SOLUTION) ×3 IMPLANT
PAD ARMBOARD 7.5X6 YLW CONV (MISCELLANEOUS) ×3 IMPLANT
PENCIL BUTTON HOLSTER BLD 10FT (ELECTRODE) ×3 IMPLANT
POUCH RETRIEVAL ECOSAC 10 (ENDOMECHANICALS) ×1 IMPLANT
POUCH RETRIEVAL ECOSAC 10MM (ENDOMECHANICALS) ×2
SCISSORS LAP 5X35 DISP (ENDOMECHANICALS) ×3 IMPLANT
SET CHOLANGIOGRAPH 5 50 .035 (SET/KITS/TRAYS/PACK) IMPLANT
SET IRRIG TUBING LAPAROSCOPIC (IRRIGATION / IRRIGATOR) ×3 IMPLANT
SET TUBE SMOKE EVAC HIGH FLOW (TUBING) ×3 IMPLANT
SLEEVE ENDOPATH XCEL 5M (ENDOMECHANICALS) ×6 IMPLANT
SPECIMEN JAR SMALL (MISCELLANEOUS) ×3 IMPLANT
SUT VIC AB 4-0 PS2 27 (SUTURE) ×3 IMPLANT
TOWEL GREEN STERILE (TOWEL DISPOSABLE) ×3 IMPLANT
TOWEL GREEN STERILE FF (TOWEL DISPOSABLE) ×3 IMPLANT
TRAY LAPAROSCOPIC MC (CUSTOM PROCEDURE TRAY) ×3 IMPLANT
TROCAR XCEL BLUNT TIP 100MML (ENDOMECHANICALS) ×3 IMPLANT
TROCAR XCEL NON-BLD 5MMX100MML (ENDOMECHANICALS) ×3 IMPLANT
WATER STERILE IRR 1000ML POUR (IV SOLUTION) ×3 IMPLANT

## 2019-08-03 NOTE — Progress Notes (Signed)
Day of Surgery   Subjective/Chief Complaint: RUQ pain a little better   Objective: Vital signs in last 24 hours: Temp:  [97.9 F (36.6 C)-98.2 F (36.8 C)] 97.9 F (36.6 C) (01/26 0418) Pulse Rate:  [61-91] 61 (01/26 0418) Resp:  [16] 16 (01/26 0418) BP: (94-132)/(60-83) 94/60 (01/26 0418) SpO2:  [97 %-100 %] 100 % (01/26 0418) Weight:  [80.3 kg] 80.3 kg (01/26 0831) Last BM Date: 08/01/19  Intake/Output from previous day: No intake/output data recorded. Intake/Output this shift: No intake/output data recorded.  General appearance: alert and cooperative Resp: clear to auscultation bilaterally Cardio: regular rate and rhythm GI: soft, tender RUQ  Lab Results:  Recent Labs    08/02/19 1232 08/03/19 0249  WBC 8.4 9.7  HGB 14.1 13.0  HCT 42.3 38.0  PLT 292 255   BMET Recent Labs    08/02/19 1232 08/03/19 0249  NA 140 140  K 3.7 4.2  CL 104 106  CO2 23 25  GLUCOSE 102* 95  BUN 8 8  CREATININE 0.78 0.77  CALCIUM 9.6 9.3   PT/INR No results for input(s): LABPROT, INR in the last 72 hours. ABG No results for input(s): PHART, HCO3 in the last 72 hours.  Invalid input(s): PCO2, PO2  Studies/Results: DG Chest 2 View  Result Date: 08/02/2019 CLINICAL DATA:  Chest pain EXAM: CHEST - 2 VIEW COMPARISON:  None. FINDINGS: The heart size and mediastinal contours are within normal limits. Both lungs are clear. No pleural effusion or pneumothorax. The visualized skeletal structures are unremarkable. IMPRESSION: No acute process in the chest. Electronically Signed   By: Macy Mis M.D.   On: 08/02/2019 13:39   CT Angio Chest PE W and/or Wo Contrast  Result Date: 08/02/2019 CLINICAL DATA:  Chest pain. Positive D-dimer. EXAM: CT ANGIOGRAPHY CHEST WITH CONTRAST TECHNIQUE: Multidetector CT imaging of the chest was performed using the standard protocol during bolus administration of intravenous contrast. Multiplanar CT image reconstructions and MIPs were obtained to  evaluate the vascular anatomy. CONTRAST:  38mL OMNIPAQUE IOHEXOL 350 MG/ML SOLN COMPARISON:  Chest x-ray dated 08/02/2019 FINDINGS: Cardiovascular: Satisfactory opacification of the pulmonary arteries to the segmental level. No evidence of pulmonary embolism. Normal heart size. No pericardial effusion. Mediastinum/Nodes: No enlarged mediastinal, hilar, or axillary lymph nodes. Thyroid gland, trachea, and esophagus demonstrate no significant findings. Lungs/Pleura: Lungs are clear. No pleural effusion or pneumothorax. Upper Abdomen: No acute abnormality. 2 cm calcified gallstone. Musculoskeletal: No chest wall abnormality. No acute or significant osseous findings. Review of the MIP images confirms the above findings. IMPRESSION: 1. No pulmonary emboli or other acute abnormalities. 2. 2 cm calcified gallstone. Electronically Signed   By: Lorriane Shire M.D.   On: 08/02/2019 18:46   US Abdomen Limited RUQ  Result Date: 08/02/2019 CLINICAL DATA:  44 year old female with chest pain, right upper quadrant pain for 1 day. 2 centimeter gallstone on chest CTA earlier today. EXAM: ULTRASOUND ABDOMEN LIMITED RIGHT UPPER QUADRANT COMPARISON:  Chest CTA 1828 hours today. FINDINGS: Gallbladder: Relatively large 24 millimeter shadowing gallstone (image 3). However, gallbladder wall thickness remains normal. No pericholecystic fluid. However, positive sonographic Percell Miller sign is reported by the technologist. Common bile duct: Diameter: 1-2 millimeters, normal. Liver: No focal lesion identified. Within normal limits in parenchymal echogenicity. Portal vein is patent on color Doppler imaging with normal direction of blood flow towards the liver. Other: Negative visible right kidney. IMPRESSION: 1. A 2.4 cm gallstone is re-demonstrated, and a positive sonographic Murphy's sign is reported, although absent  gallbladder wall thickening argues against Acute Cholecystitis. 2. No evidence of bile duct obstruction.  Negative liver.  Electronically Signed   By: Odessa Fleming M.D.   On: 08/02/2019 20:25    Anti-infectives: Anti-infectives (From admission, onward)   Start     Dose/Rate Route Frequency Ordered Stop   08/03/19 2115  [facility] Hold]  cefTRIAXone (ROCEPHIN) 2 g in sodium chloride 0.9 % 100 mL IVPB     (MAR Hold since Tue 08/03/2019 at 0817.Hold Reason: Transfer to a Procedural area.)   2 g 200 mL/hr over 30 Minutes Intravenous Every 24 hours 08/02/19 2120     08/02/19 2100  cefTRIAXone (ROCEPHIN) 2 g in sodium chloride 0.9 % 100 mL IVPB     2 g 200 mL/hr over 30 Minutes Intravenous  Once 08/02/19 2052 08/02/19 2144      Assessment/Plan: Cholecystitis - for laparoscopic cholecystectomy, IOC. Procedure, risks, and benefits discussed. She agrees.  LOS: 0 days    Liz Malady 08/03/2019

## 2019-08-03 NOTE — Anesthesia Procedure Notes (Signed)
Procedure Name: Intubation Date/Time: 08/03/2019 10:24 AM Performed by: Larene Beach, CRNA Pre-anesthesia Checklist: Patient identified, Emergency Drugs available, Suction available and Patient being monitored Patient Re-evaluated:Patient Re-evaluated prior to induction Oxygen Delivery Method: Circle system utilized Preoxygenation: Pre-oxygenation with 100% oxygen Induction Type: IV induction Ventilation: Mask ventilation without difficulty Laryngoscope Size: Mac and 3 Grade View: Grade I Tube type: Oral Tube size: 7.0 mm Number of attempts: 1 Airway Equipment and Method: Stylet Placement Confirmation: ETT inserted through vocal cords under direct vision,  positive ETCO2 and breath sounds checked- equal and bilateral Secured at: 22 cm Tube secured with: Tape Dental Injury: Teeth and Oropharynx as per pre-operative assessment

## 2019-08-03 NOTE — Anesthesia Preprocedure Evaluation (Addendum)
Anesthesia Evaluation  Patient identified by MRN, date of birth, ID band Patient awake    Reviewed: Allergy & Precautions, H&P , NPO status , Patient's Chart, lab work & pertinent test results  Airway Mallampati: II  TM Distance: >3 FB Neck ROM: Full    Dental no notable dental hx. (+) Teeth Intact, Dental Advisory Given   Pulmonary neg pulmonary ROS,    Pulmonary exam normal breath sounds clear to auscultation       Cardiovascular negative cardio ROS   Rhythm:Regular Rate:Normal     Neuro/Psych negative neurological ROS  negative psych ROS   GI/Hepatic negative GI ROS, Neg liver ROS,   Endo/Other  negative endocrine ROS  Renal/GU negative Renal ROS  negative genitourinary   Musculoskeletal   Abdominal   Peds  Hematology negative hematology ROS (+)   Anesthesia Other Findings   Reproductive/Obstetrics negative OB ROS                            Anesthesia Physical Anesthesia Plan  ASA: I  Anesthesia Plan: General   Post-op Pain Management:    Induction: Intravenous  PONV Risk Score and Plan: 4 or greater and Ondansetron, Dexamethasone and Midazolam  Airway Management Planned: Oral ETT  Additional Equipment:   Intra-op Plan:   Post-operative Plan: Extubation in OR  Informed Consent: I have reviewed the patients History and Physical, chart, labs and discussed the procedure including the risks, benefits and alternatives for the proposed anesthesia with the patient or authorized representative who has indicated his/her understanding and acceptance.     Dental advisory given  Plan Discussed with: CRNA  Anesthesia Plan Comments:         Anesthesia Quick Evaluation  

## 2019-08-03 NOTE — Discharge Summary (Signed)
[facility] Discharge Summary   Patient ID: Joyce Cervantes MRN: [id number] DOB/AGE: October 30, 1975 44 y.o.  Admit date: 08/02/2019 Discharge date: 08/04/2019  Admitting Diagnosis: Cholecystitis  Discharge Diagnosis Patient Active Problem List   Diagnosis Date Noted  . Cholecystitis 08/02/2019    Consultants None  Imaging: DG Chest 2 View  Result Date: 08/02/2019 CLINICAL DATA:  Chest pain EXAM: CHEST - 2 VIEW COMPARISON:  None. FINDINGS: The heart size and mediastinal contours are within normal limits. Both lungs are clear. No pleural effusion or pneumothorax. The visualized skeletal structures are unremarkable. IMPRESSION: No acute process in the chest. Electronically Signed   By: Macy Mis M.D.   On: 08/02/2019 13:39   CT Angio Chest PE W and/or Wo Contrast  Result Date: 08/02/2019 CLINICAL DATA:  Chest pain. Positive D-dimer. EXAM: CT ANGIOGRAPHY CHEST WITH CONTRAST TECHNIQUE: Multidetector CT imaging of the chest was performed using the standard protocol during bolus administration of intravenous contrast. Multiplanar CT image reconstructions and MIPs were obtained to evaluate the vascular anatomy. CONTRAST:  60mL OMNIPAQUE IOHEXOL 350 MG/ML SOLN COMPARISON:  Chest x-ray dated 08/02/2019 FINDINGS: Cardiovascular: Satisfactory opacification of the pulmonary arteries to the segmental level. No evidence of pulmonary embolism. Normal heart size. No pericardial effusion. Mediastinum/Nodes: No enlarged mediastinal, hilar, or axillary lymph nodes. Thyroid gland, trachea, and esophagus demonstrate no significant findings. Lungs/Pleura: Lungs are clear. No pleural effusion or pneumothorax. Upper Abdomen: No acute abnormality. 2 cm calcified gallstone. Musculoskeletal: No chest wall abnormality. No acute or significant osseous findings. Review of the MIP images confirms the above findings. IMPRESSION: 1. No pulmonary emboli or other acute abnormalities. 2. 2 cm calcified  gallstone. Electronically Signed   By: Lorriane Shire M.D.   On: 08/02/2019 18:46   US Abdomen Limited RUQ  Result Date: 08/02/2019 CLINICAL DATA:  44 year old female with chest pain, right upper quadrant pain for 1 day. 2 centimeter gallstone on chest CTA earlier today. EXAM: ULTRASOUND ABDOMEN LIMITED RIGHT UPPER QUADRANT COMPARISON:  Chest CTA 1828 hours today. FINDINGS: Gallbladder: Relatively large 24 millimeter shadowing gallstone (image 3). However, gallbladder wall thickness remains normal. No pericholecystic fluid. However, positive sonographic Percell Miller sign is reported by the technologist. Common bile duct: Diameter: 1-2 millimeters, normal. Liver: No focal lesion identified. Within normal limits in parenchymal echogenicity. Portal vein is patent on color Doppler imaging with normal direction of blood flow towards the liver. Other: Negative visible right kidney. IMPRESSION: 1. A 2.4 cm gallstone is re-demonstrated, and a positive sonographic Murphy's sign is reported, although absent gallbladder wall thickening argues against Acute Cholecystitis. 2. No evidence of bile duct obstruction.  Negative liver. Electronically Signed   By: Genevie Ann M.D.   On: 08/02/2019 20:25    Procedures Dr. Grandville Silos (08/03/2019) - Laparoscopic Cholecystectomy  Hospital Course:  Joyce Cervantes is a 44yo female who presented to [facility] 1/25 with acute onset right chest pain. CTA and cardiac work up negative.  She had a 2 cm calcified gallstone noted on CT angio.  She then underwent an u/s that showed a 2.4 cm gallstone and had a sonographic murphys sign as well.   LFTs are normal. Patient was admitted and underwent procedure listed above.  Tolerated procedure well and was transferred to the floor.  Diet was advanced as tolerated.  Patient did have some issues with nausea, along with abdominal pain from her procedure. This gradually improved with time. On POD#1 she was tolerating a diet, mobilizing well, incisions cdi,  vital signs stable  and felt stable for discharge home. She will follow up as below, and knows to call with questions or concerns.  I have personally reviewed the patients medication history on the [place] controlled substance database.     Allergies as of 08/04/2019      Reactions   Bee Venom Anaphylaxis   Raspberry Rash      Medication List    TAKE these medications   acetaminophen 325 MG tablet Commonly known as: TYLENOL Take 2 tablets (650 mg total) by mouth every 6 (six) hours as needed for mild pain (or temp > 100).   HYDROcodone-acetaminophen 5-325 MG tablet Commonly known as: NORCO/VICODIN Take 1 tablet by mouth every 6 (six) hours as needed for severe pain.   ibuprofen 200 MG tablet Commonly known as: ADVIL Take 400 mg by mouth every 6 (six) hours as needed for headache (pain/cramps).   ondansetron 4 MG disintegrating tablet Commonly known as: ZOFRAN-ODT Take 1 tablet (4 mg total) by mouth every 6 (six) hours as needed for nausea or vomiting.        Follow-up Information    [facility] Follow up on 08/19/2019.   Specialty: General Surgery Why: Follow up appointment scheduled for 11AM. A provider will call you during scheduled appointment time. Please send a photo of incisions,license, and insurance card to [email or web address] the day prior to appointment.   Contact information: [address] [place] [address] [phone]           Signed: Franne Forts, [facility] 08/03/2019, 2:55 PM Please see Amion for pager number during day hours 7:00am-4:30pm

## 2019-08-03 NOTE — Transfer of Care (Signed)
Immediate Anesthesia Transfer of Care Note  Patient: Joyce Cervantes  Procedure(s) Performed: LAPAROSCOPIC CHOLECYSTECTOMY (N/A Abdomen)  Patient Location: PACU  Anesthesia Type:General  Level of Consciousness: drowsy and patient cooperative  Airway & Oxygen Therapy: Patient Spontanous Breathing  Post-op Assessment: Report given to RN, Post -op Vital signs reviewed and stable and Patient moving all extremities X 4  Post vital signs: Reviewed and stable  Last Vitals:  Vitals Value Taken Time  BP 138/80 08/03/19 1100  Temp    Pulse 93 08/03/19 1102  Resp 18 08/03/19 1102  SpO2 98 % 08/03/19 1102  Vitals shown include unvalidated device data.  Last Pain:  Vitals:   08/03/19 0831  TempSrc:   PainSc: 0-No pain      Patients Stated Pain Goal: 4 (08/03/19 0753)  Complications: No apparent anesthesia complications

## 2019-08-03 NOTE — Anesthesia Postprocedure Evaluation (Signed)
Anesthesia Post Note  Patient: Joyce Cervantes  Procedure(s) Performed: LAPAROSCOPIC CHOLECYSTECTOMY (N/A Abdomen)     Patient location during evaluation: PACU Anesthesia Type: General Level of consciousness: awake and alert Pain management: pain level controlled Vital Signs Assessment: post-procedure vital signs reviewed and stable Respiratory status: spontaneous breathing, nonlabored ventilation and respiratory function stable Cardiovascular status: blood pressure returned to baseline and stable Postop Assessment: no apparent nausea or vomiting Anesthetic complications: no    Last Vitals:  Vitals:   08/03/19 1129 08/03/19 1143  BP: 108/73 112/77  Pulse: 81 88  Resp: 11 11  Temp:  36.7 C  SpO2: 97% 98%    Last Pain:  Vitals:   08/03/19 1143  TempSrc:   PainSc: 2                  Morghan Kester,W. EDMOND

## 2019-08-03 NOTE — Op Note (Signed)
  08/03/2019  10:53 AM  PATIENT:  Joyce Cervantes  44 y.o. female  PRE-OPERATIVE DIAGNOSIS:  cholecystitis  POST-OPERATIVE DIAGNOSIS:  cholecystitis  PROCEDURE:  Procedure(s): LAPAROSCOPIC CHOLECYSTECTOMY  SURGEON:  Surgeon(s): Violeta Gelinas, MD  ASSISTANTS: none   ANESTHESIA:   local and general  EBL:  Total I/O In: 0  Out: 5 [Blood:5]  BLOOD ADMINISTERED:none  DRAINS: none   SPECIMEN:  Excision  DISPOSITION OF SPECIMEN:  PATHOLOGY  COUNTS:  YES  DICTATION: .Dragon Dictation Findings: Acute cholecystitis  Procedure in detail: Informed consent was obtained.  She received intravenous antibiotics.  She was brought to the operating room and general endotracheal anesthesia was administered by the anesthesia staff.  Her abdomen was prepped and draped in a sterile fashion.  Timeout procedure was performed.The infraumbilical region was infiltrated with local. Infraumbilical incision was made. Subcutaneous tissues were dissected down revealing the anterior fascia. This was divided sharply along the midline. Peritoneal cavity was entered under direct vision without complication. A 0 Vicryl pursestring was placed around the fascial opening. Hassan trocar was inserted into the abdomen. The abdomen was insufflated with carbon dioxide in standard fashion. Under direct vision a 5 mm epigastric and two 5 mm right-sided ports were placed.  Local was used at each port site.  The gallbladder was noted to be acutely inflamed.  The dome was retracted superior medially.  The infundibulum was retracted inferolaterally.  Dissection began laterally and progressed medially identifying the cystic duct and cystic artery.  2 clips were placed proximally in the cystic artery, one was placed distally and it was divided.  Further dissection achieved a critical view of the cystic duct.  3 clips were placed proximally in the cystic duct and one was placed distally and it was divided.  The gallbladder was  taken off the liver bed with cautery achieving excellent hemostasis.  The gallbladder was placed in a bag and removed from the abdomen.  It was sent to pathology.  Liver bed was irrigated and hemostasis was ensured.  Clips remain in good position.  Irrigation fluid was evacuated.  Ports were removed under direct vision.  Pneumoperitoneum was released.  Infraumbilical fascia was closed by tying the pursestring.  All 4 wounds were irrigated and the skin of each was closed with 4-0 Vicryl followed by Dermabond.  All counts were correct.  She tolerated procedure well without apparent complication was taken recovery in stable condition.  PATIENT DISPOSITION:  PACU - hemodynamically stable.   Delay start of Pharmacological VTE agent (>24hrs) due to surgical blood loss or risk of bleeding:  no  Violeta Gelinas, MD, MPH, FACS Pager: [phone]  1/26/202110:53 AM

## 2019-08-04 LAB — SURGICAL PATHOLOGY

## 2019-08-04 MED ORDER — ACETAMINOPHEN 325 MG PO TABS: 650.0000 mg | ORAL_TABLET | Freq: Four times a day (QID) | ORAL | Status: DC

## 2019-08-04 MED ORDER — ONDANSETRON 4 MG PO TBDP: 4.0000 mg | ORAL_TABLET | Freq: Four times a day (QID) | ORAL | 0 refills | Status: AC | PRN

## 2019-08-04 MED ORDER — HYDROCODONE-ACETAMINOPHEN 5-325 MG PO TABS: 1.0000 | ORAL_TABLET | ORAL | Status: DC | PRN

## 2019-08-04 MED ORDER — HYDROCODONE-ACETAMINOPHEN 5-325 MG PO TABS: 1.0000 | ORAL_TABLET | Freq: Four times a day (QID) | ORAL | 0 refills | Status: AC | PRN

## 2019-08-04 MED ORDER — KETOROLAC TROMETHAMINE 15 MG/ML IJ SOLN
15.0000 mg | Freq: Four times a day (QID) | INTRAMUSCULAR | Status: DC
  Administered 2019-08-04: 15 mg via INTRAVENOUS
  Filled 2019-08-04: qty 1

## 2019-08-04 MED FILL — ONDANSETRON ODT 4 MG TABLET: 4 | 14 days supply | Qty: 20 | Fill #0

## 2019-08-04 MED FILL — HYDROCODON-APAP 5-325: 5-325 | 5 days supply | Qty: 20 | Fill #0

## 2019-08-04 NOTE — Progress Notes (Addendum)
1 Day Post-Op    CC: Abdominal pain  Subjective: Patient sitting up trying to vomit.  She is taking a lot of oxycodone and rates her pain 8/10.  Some of her nausea may be related to the oxycodone.  Port sites all look fine.   Objective: Vital signs in last 24 hours: Temp:  [97 F (36.1 C)-99.5 F (37.5 C)] 98 F (36.7 C) (01/27 0607) Pulse Rate:  [73-100] 73 (01/27 0607) Resp:  [10-18] 18 (01/27 0607) BP: (99-138)/(68-82) 99/69 (01/27 0607) SpO2:  [97 %-100 %] 99 % (01/27 0607) Last BM Date: 08/01/19 Nothing p.o. recorded 1036 IV Urine x1 Afebrile vital signs are stable  Intake/Output from previous day: 01/26 0701 - 01/27 0700 In: 1036 [I.V.:1036] Out: 5 [Blood:5] Intake/Output this shift: No intake/output data recorded.  General appearance: alert, cooperative and no distress Resp: clear to auscultation bilaterally GI: Soft, sore, port sites all look fine.  Some ecchymosis around the laparoscopic port. Extremities: extremities normal, atraumatic, no cyanosis or edema  Lab Results:  Recent Labs    08/02/19 1232 08/03/19 0249  WBC 8.4 9.7  HGB 14.1 13.0  HCT 42.3 38.0  PLT 292 255    BMET Recent Labs    08/02/19 1232 08/03/19 0249  NA 140 140  K 3.7 4.2  CL 104 106  CO2 23 25  GLUCOSE 102* 95  BUN 8 8  CREATININE 0.78 0.77  CALCIUM 9.6 9.3   PT/INR No results for input(s): LABPROT, INR in the last 72 hours.  Recent Labs  Lab 08/02/19 1425 08/03/19 0249  AST 19 18  ALT 17 14  ALKPHOS 62 52  BILITOT 0.7 0.8  PROT 7.4 6.4*  ALBUMIN 4.3 3.7     Lipase     Component Value Date/Time   LIPASE 22 08/02/2019 2300     Prior to Admission medications   Medication Sig Start Date End Date Taking? Authorizing Provider  ibuprofen (ADVIL) 200 MG tablet Take 400 mg by mouth every 6 (six) hours as needed for headache (pain/cramps).   Yes [provider]  acetaminophen (TYLENOL) 325 MG tablet Take 2 tablets (650 mg total) by mouth every 6 (six)  hours as needed for mild pain (or temp > 100). 08/03/19   Meuth, Brooke A, PA-C  oxyCODONE (OXY IR/ROXICODONE) 5 MG immediate release tablet Take 1-2 tablets (5-10 mg total) by mouth every 6 (six) hours as needed for moderate pain or severe pain. 08/03/19   Meuth, Lina Sar, PA-C   ' Medications: . enoxaparin (LOVENOX) injection  40 mg Subcutaneous Q24H   . sodium chloride 50 mL/hr at 08/03/19 2245   Assessment/Plan Acute cholecystitis/cholelithiasis Laparoscopic cholecystectomy 08/03/2019 Dr. Violeta Gelinas  FEN: IV fluids/n.p.o. ID: Rocephin DVT: SCDs Follow-up [facility]   Plan: Discontinue the oxycodone and try her on hydrocodone and Toradol for now.  Mobilize and advance her diet.  She improves hopefully we get her discharge later today. 2:20 PM Still nauseated, she has a soft diet in front of her, but has not taken anything but clears so far.  Will try some Zofran and see how she does.  I will put her on scheduled Tylenol, Toradol and she can have Hydrocodone/acetaminophen PRN.    Continue IV fluids till she is tolerating PO's.  Recheck labs in AM.    LOS: 0 days    Joyce Cervantes 08/04/2019 Please see Amion

## 2020-11-01 IMAGING — CT CT ANGIO CHEST
2 of 7 series · 19 of 46 positions shown · IV contrast (APPLIED)
Comparison: Chest x-ray dated 08/02/2019

CLINICAL DATA: Chest pain. Positive D-dimer.

EXAM:
CT ANGIOGRAPHY CHEST WITH CONTRAST
TECHNIQUE: Multidetector CT imaging of the chest was performed using the
standard protocol during bolus administration of intravenous
contrast. Multiplanar CT image reconstructions and MIPs were
obtained to evaluate the vascular anatomy.
CONTRAST:  80mL OMNIPAQUE IOHEXOL 350 MG/ML SOLN

[Series 7: thins · axial · 0.58mm/px · z∈[-276,-52]mm · 16 of 360 slices shown]
[im 20/360  lung]
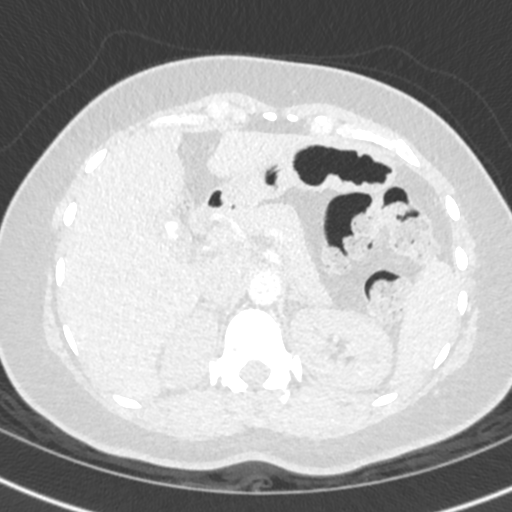
[im 40/360  soft-tissue]
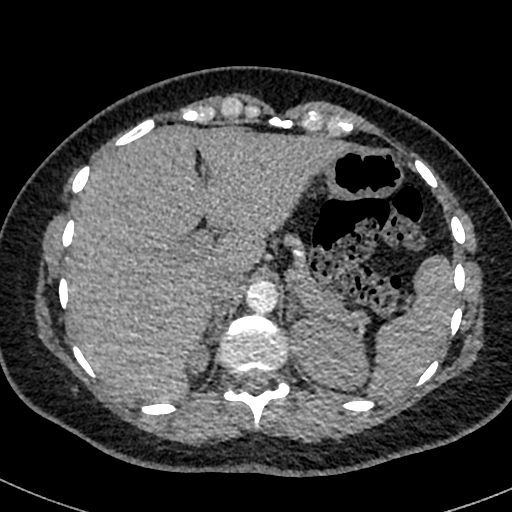
[im 60/360  lung]
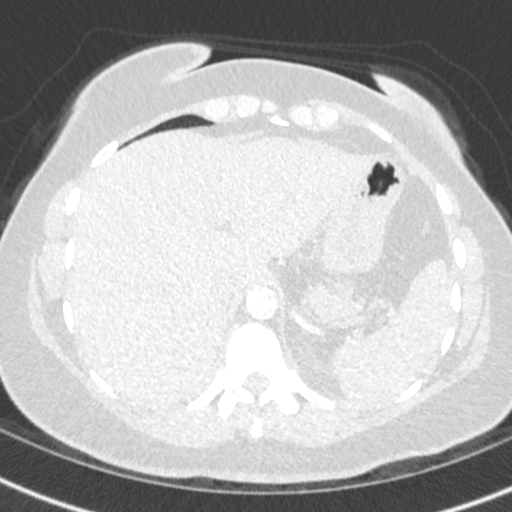
[im 80/360  soft-tissue]
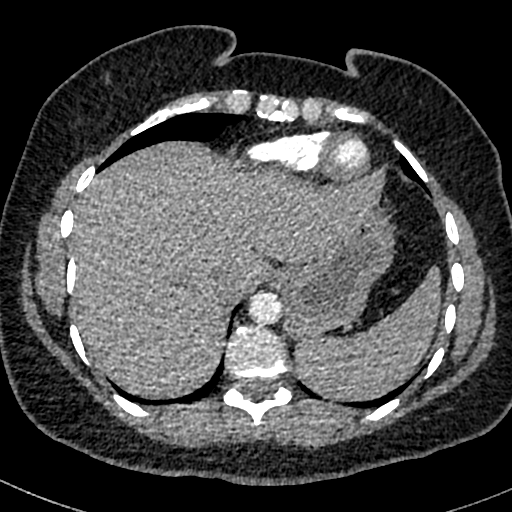
[im 100/360  lung]
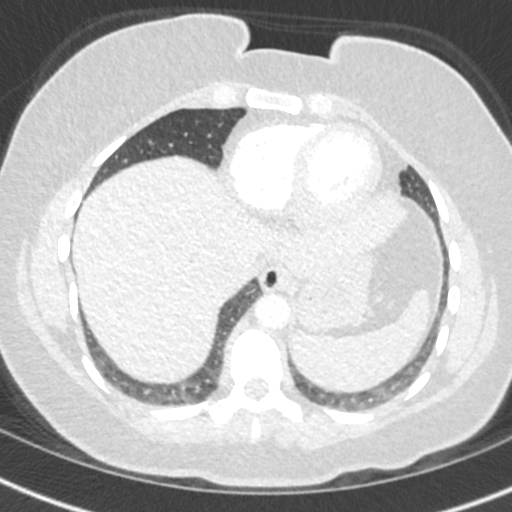
[im 120/360  soft-tissue]
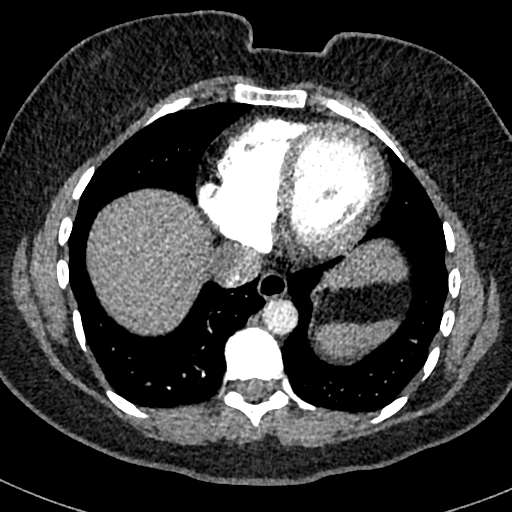
[im 140/360  lung]
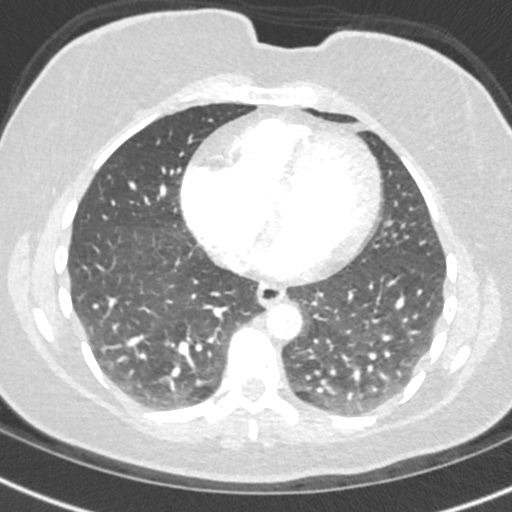
[im 160/360  soft-tissue]
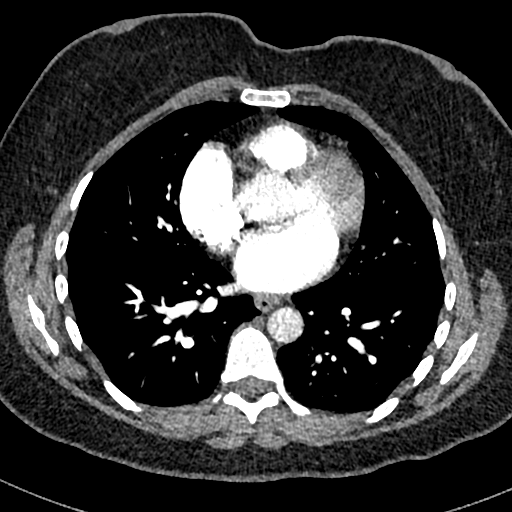
[im 200/360  lung]
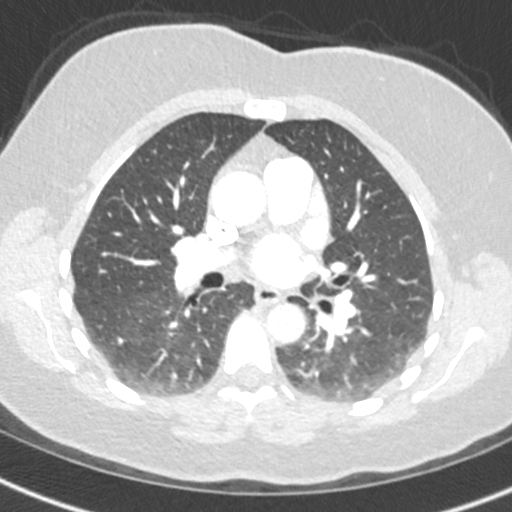
[im 220/360  soft-tissue]
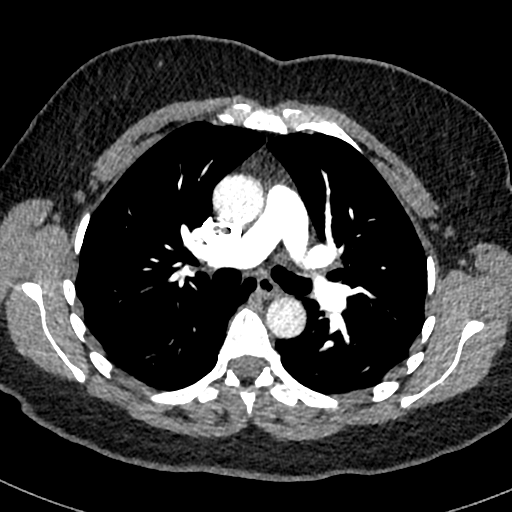
[im 240/360  lung]
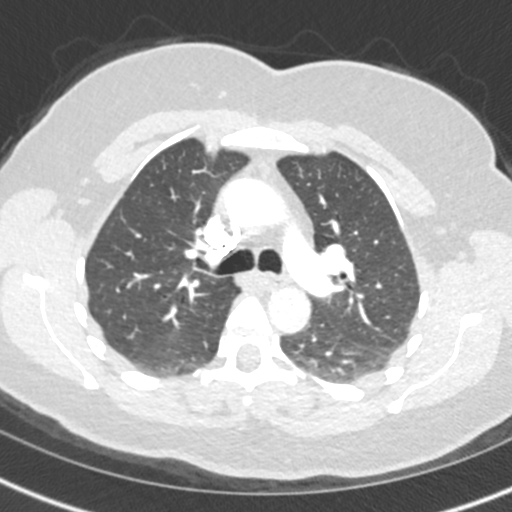
[im 260/360  soft-tissue]
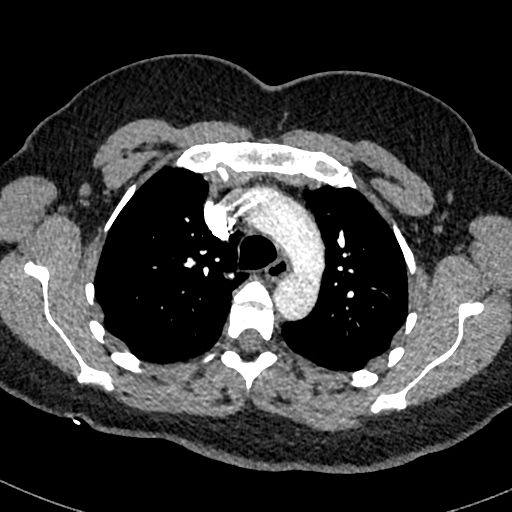
[im 280/360  lung]
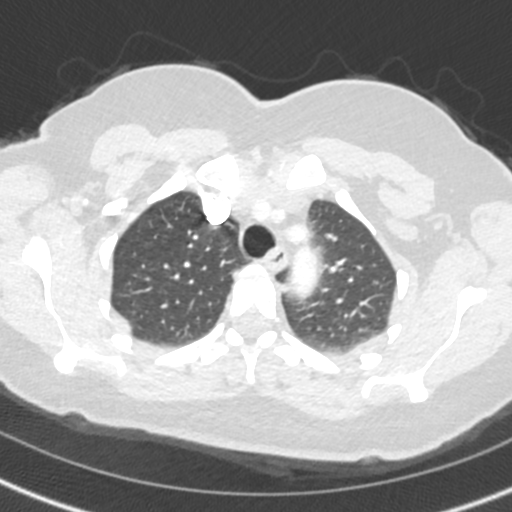
[im 300/360  soft-tissue]
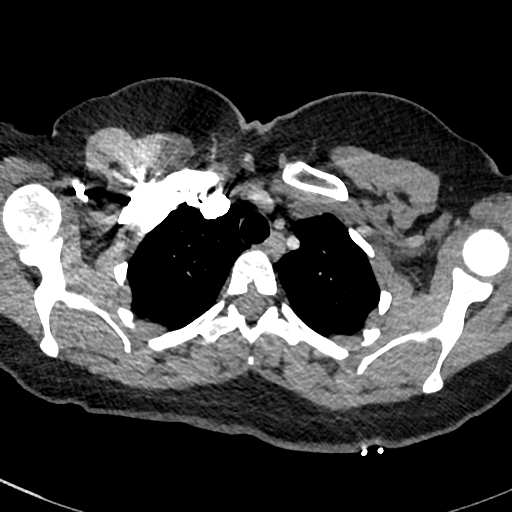
[im 320/360  lung]
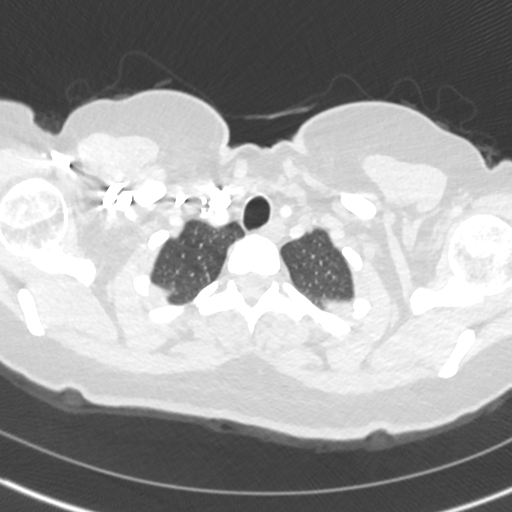
[im 340/360  soft-tissue]
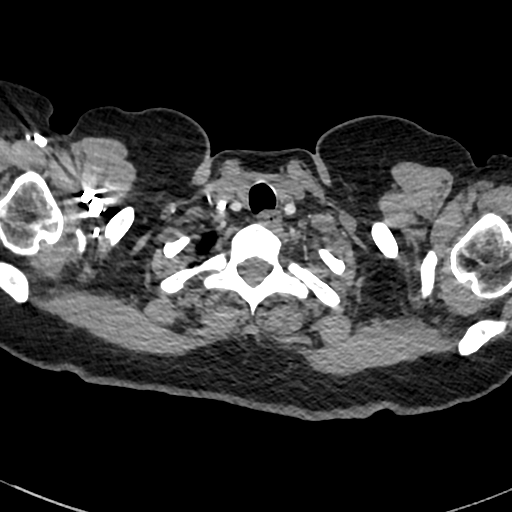

[Series 8: cor · coronal · 0.52mm/px · 3 of 126 slices shown]
[im 32/126  soft-tissue]
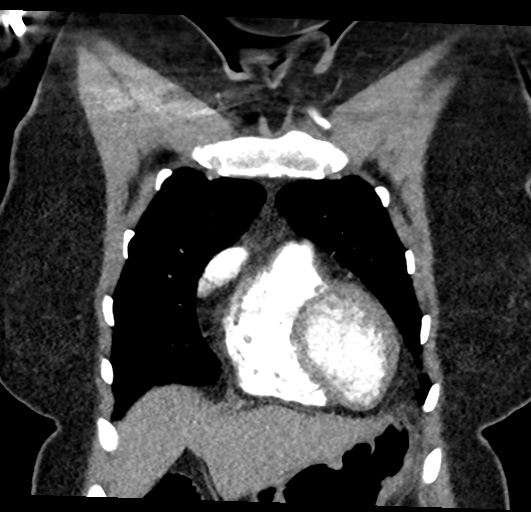
[im 63/126  soft-tissue]
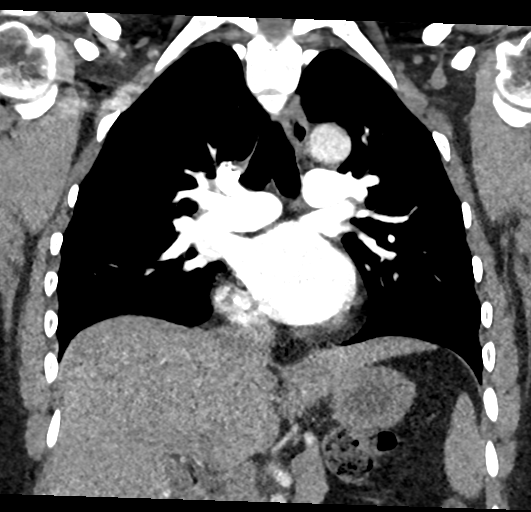
[im 94/126  soft-tissue]
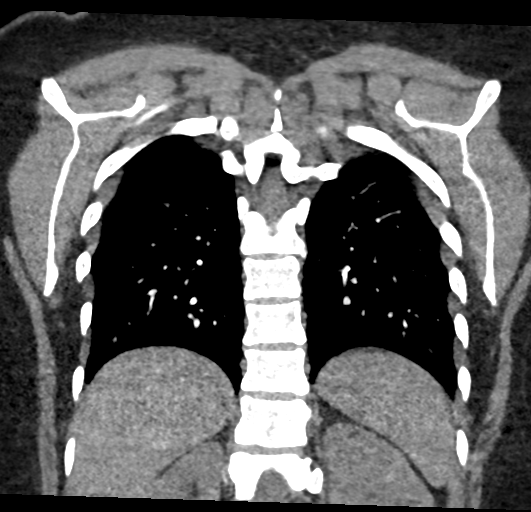

[19 of 46 positions shown; findings below may reference images not displayed]

FINDINGS: Cardiovascular: Satisfactory opacification of the pulmonary arteries
to the segmental level. No evidence of pulmonary embolism. Normal
heart size. No pericardial effusion.

Mediastinum/Nodes: No enlarged mediastinal, hilar, or axillary lymph
nodes. Thyroid gland, trachea, and esophagus demonstrate no
significant findings.

Lungs/Pleura: Lungs are clear. No pleural effusion or pneumothorax.

Upper Abdomen: No acute abnormality. 2 cm calcified gallstone.

Musculoskeletal: No chest wall abnormality. No acute or significant
osseous findings.

Review of the MIP images confirms the above findings.
IMPRESSION: 1. No pulmonary emboli or other acute abnormalities.
2. 2 cm calcified gallstone.
